# Patient Record
Sex: Male | Born: 1960 | Race: White | Hispanic: No | Marital: Married | Smoking: Never smoker
Health system: Southern US, Community
[De-identification: ages and names within clinical notes are randomized; demographics above are authoritative.]

## PROBLEM LIST (undated history)

## (undated) DIAGNOSIS — E785 Hyperlipidemia, unspecified: Secondary | ICD-10-CM

## (undated) DIAGNOSIS — M545 Low back pain, unspecified: Secondary | ICD-10-CM

## (undated) DIAGNOSIS — Z87442 Personal history of urinary calculi: Secondary | ICD-10-CM

## (undated) DIAGNOSIS — R079 Chest pain, unspecified: Secondary | ICD-10-CM

## (undated) DIAGNOSIS — R12 Heartburn: Secondary | ICD-10-CM

## (undated) DIAGNOSIS — R519 Headache, unspecified: Secondary | ICD-10-CM

## (undated) DIAGNOSIS — R51 Headache: Secondary | ICD-10-CM

## (undated) HISTORY — DX: Headache: R51

## (undated) HISTORY — DX: Low back pain, unspecified: M54.50

## (undated) HISTORY — DX: Personal history of urinary calculi: Z87.442

## (undated) HISTORY — DX: Chest pain, unspecified: R07.9

## (undated) HISTORY — PX: CARDIAC CATHETERIZATION: SHX172

## (undated) HISTORY — PX: HAND SURGERY: SHX662

## (undated) HISTORY — PX: NEPHROLITHOTOMY: SUR881

## (undated) HISTORY — PX: OTHER SURGICAL HISTORY: SHX169

## (undated) HISTORY — DX: Low back pain: M54.5

## (undated) HISTORY — DX: Headache, unspecified: R51.9

## (undated) HISTORY — DX: Hyperlipidemia, unspecified: E78.5

## (undated) HISTORY — DX: Heartburn: R12

---

## 2001-10-17 ENCOUNTER — Emergency Department (HOSPITAL_COMMUNITY): Admission: EM | Admit: 2001-10-17 | Discharge: 2001-10-17 | Payer: Self-pay | Admitting: Emergency Medicine

## 2001-10-17 ENCOUNTER — Encounter: Payer: Self-pay | Admitting: Emergency Medicine

## 2001-10-21 ENCOUNTER — Encounter: Payer: Self-pay | Admitting: Urology

## 2001-10-21 ENCOUNTER — Ambulatory Visit (HOSPITAL_COMMUNITY): Admission: RE | Admit: 2001-10-21 | Discharge: 2001-10-21 | Payer: Self-pay | Admitting: Urology

## 2001-11-09 ENCOUNTER — Ambulatory Visit (HOSPITAL_COMMUNITY): Admission: RE | Admit: 2001-11-09 | Discharge: 2001-11-09 | Payer: Self-pay | Admitting: Urology

## 2001-11-09 ENCOUNTER — Encounter: Payer: Self-pay | Admitting: Urology

## 2001-11-11 ENCOUNTER — Encounter: Payer: Self-pay | Admitting: Urology

## 2001-11-11 ENCOUNTER — Ambulatory Visit (HOSPITAL_COMMUNITY): Admission: RE | Admit: 2001-11-11 | Discharge: 2001-11-11 | Payer: Self-pay | Admitting: Urology

## 2001-12-09 ENCOUNTER — Ambulatory Visit (HOSPITAL_COMMUNITY): Admission: RE | Admit: 2001-12-09 | Discharge: 2001-12-09 | Payer: Self-pay | Admitting: Urology

## 2001-12-09 ENCOUNTER — Encounter: Payer: Self-pay | Admitting: Urology

## 2002-03-03 ENCOUNTER — Ambulatory Visit (HOSPITAL_COMMUNITY): Admission: RE | Admit: 2002-03-03 | Discharge: 2002-03-03 | Payer: Self-pay | Admitting: Urology

## 2002-03-03 ENCOUNTER — Encounter: Payer: Self-pay | Admitting: Urology

## 2002-03-16 ENCOUNTER — Ambulatory Visit (HOSPITAL_COMMUNITY): Admission: RE | Admit: 2002-03-16 | Discharge: 2002-03-16 | Payer: Self-pay | Admitting: Urology

## 2002-03-16 ENCOUNTER — Encounter: Payer: Self-pay | Admitting: Urology

## 2002-03-24 ENCOUNTER — Emergency Department (HOSPITAL_COMMUNITY): Admission: EM | Admit: 2002-03-24 | Discharge: 2002-03-24 | Payer: Self-pay

## 2002-03-27 ENCOUNTER — Inpatient Hospital Stay (HOSPITAL_COMMUNITY): Admission: EM | Admit: 2002-03-27 | Discharge: 2002-03-30 | Payer: Self-pay | Admitting: Emergency Medicine

## 2002-03-27 ENCOUNTER — Encounter: Payer: Self-pay | Admitting: Urology

## 2002-03-29 ENCOUNTER — Encounter: Payer: Self-pay | Admitting: Urology

## 2002-03-30 ENCOUNTER — Encounter: Payer: Self-pay | Admitting: Urology

## 2006-05-23 ENCOUNTER — Emergency Department (HOSPITAL_COMMUNITY): Admission: EM | Admit: 2006-05-23 | Discharge: 2006-05-23 | Payer: Self-pay | Admitting: Emergency Medicine

## 2006-06-03 ENCOUNTER — Encounter: Admission: RE | Admit: 2006-06-03 | Discharge: 2006-06-03 | Payer: Self-pay | Admitting: Cardiology

## 2006-06-04 ENCOUNTER — Encounter: Payer: Self-pay | Admitting: Cardiology

## 2007-06-02 ENCOUNTER — Encounter: Admission: RE | Admit: 2007-06-02 | Discharge: 2007-06-02 | Payer: Self-pay | Admitting: Family Medicine

## 2008-05-31 ENCOUNTER — Emergency Department (HOSPITAL_COMMUNITY): Admission: EM | Admit: 2008-05-31 | Discharge: 2008-06-01 | Payer: Self-pay | Admitting: Emergency Medicine

## 2009-06-20 ENCOUNTER — Ambulatory Visit (HOSPITAL_BASED_OUTPATIENT_CLINIC_OR_DEPARTMENT_OTHER): Admission: RE | Admit: 2009-06-20 | Discharge: 2009-06-20 | Payer: Self-pay | Admitting: Orthopedic Surgery

## 2010-01-15 ENCOUNTER — Emergency Department (HOSPITAL_BASED_OUTPATIENT_CLINIC_OR_DEPARTMENT_OTHER): Admission: EM | Admit: 2010-01-15 | Discharge: 2010-01-15 | Payer: Self-pay | Admitting: Emergency Medicine

## 2010-02-08 ENCOUNTER — Ambulatory Visit: Payer: Self-pay | Admitting: Diagnostic Radiology

## 2010-02-08 ENCOUNTER — Emergency Department (HOSPITAL_BASED_OUTPATIENT_CLINIC_OR_DEPARTMENT_OTHER): Admission: EM | Admit: 2010-02-08 | Discharge: 2010-02-08 | Payer: Self-pay | Admitting: Emergency Medicine

## 2010-06-25 ENCOUNTER — Ambulatory Visit: Payer: Self-pay | Admitting: Cardiology

## 2010-06-26 ENCOUNTER — Ambulatory Visit: Payer: Self-pay | Admitting: Cardiology

## 2010-07-03 ENCOUNTER — Telehealth (INDEPENDENT_AMBULATORY_CARE_PROVIDER_SITE_OTHER): Payer: Self-pay | Admitting: Radiology

## 2010-07-07 ENCOUNTER — Encounter (HOSPITAL_COMMUNITY)
Admission: RE | Admit: 2010-07-07 | Discharge: 2010-07-29 | Payer: Self-pay | Source: Home / Self Care | Attending: Cardiology | Admitting: Cardiology

## 2010-07-07 ENCOUNTER — Ambulatory Visit: Admission: RE | Admit: 2010-07-07 | Discharge: 2010-07-07 | Payer: Self-pay | Source: Home / Self Care

## 2010-07-07 ENCOUNTER — Encounter: Payer: Self-pay | Admitting: Cardiology

## 2010-07-20 ENCOUNTER — Encounter: Payer: Self-pay | Admitting: Cardiology

## 2010-07-31 NOTE — Assessment & Plan Note (Signed)
Summary: Cardiology Nuclear Testing  Nuclear Med Background Indications for Stress Test: Evaluation for Ischemia   History: Myocardial Perfusion Study  History Comments: '07 MPI -nml  Symptoms: Chest Pain    Nuclear Pre-Procedure Cardiac Risk Factors: Family History - CAD, Lipids Caffeine/Decaff Intake: None NPO After: 7:15 AM Lungs: clear IV 0.9% NS with Angio Cath: 20g     IV Site: R Antecubital IV Started by: Irean Hong, RN Chest Size (in) 42     Height (in): 72 Weight (lb): 198 BMI: 26.95  Nuclear Med Study 1 or 2 day study:  1 day     Stress Test Type:  Stress Reading MD:  Olga Millers, MD     Referring MD:  S.Tennant Resting Radionuclide:  Technetium 79m Tetrofosmin     Resting Radionuclide Dose:  11 mCi  Stress Radionuclide:  Technetium 24m Tetrofosmin     Stress Radionuclide Dose:  33 mCi   Stress Protocol Exercise Time (min):  9:00 min     Max HR:  148 bpm     Predicted Max HR:  170 bpm  Max Systolic BP: 202 mm Hg     Percent Max HR:  87.06 %     METS: 10.4 Rate Pressure Product:  16109    Stress Test Technologist:  Milana Na, EMT-P     Nuclear Technologist:  Domenic Polite, CNMT  Rest Procedure  Myocardial perfusion imaging was performed at rest 45 minutes following the intravenous administration of Technetium 53m Tetrofosmin.  Stress Procedure  The patient exercised for 9:00. The patient stopped due to fatigue and denied any chest pain.  There were non specific ST-T wave changes.  Technetium 27m Tetrofosmin was injected at peak exercise and myocardial perfusion imaging was performed after a brief delay.  QPS Raw Data Images:  Acquisition technically good; normal left ventricular size. Stress Images:  There is decreased uptake in the apex. Rest Images:  There is decreased uptake in the apex. Subtraction (SDS):  No evidence of ischemia. Transient Ischemic Dilatation:  1.06  (Normal <1.22)  Lung/Heart Ratio:  .33  (Normal <0.45)  Quantitative  Gated Spect Images QGS EDV:  110 ml QGS ESV:  44 ml QGS EF:  60 % QGS cine images:  Normal wall motion.   Overall Impression  Exercise Capacity: Good exercise capacity. BP Response: Normal blood pressure response. Clinical Symptoms: No chest pain ECG Impression: No significant ST segment change suggestive of ischemia. Overall Impression: Normal stress nuclear study with apical thinning but no ischemia.  Appended Document: Cardiology Nuclear Testing COPY SENT TO DR.TENNANT

## 2010-07-31 NOTE — Progress Notes (Signed)
Summary: nuc pre-procedure  Phone Note Outgoing Call   Call placed by: Domenic Polite, CNMT,  July 03, 2010 1:29 PM Call placed to: Patient Reason for Call: Confirm/change Appt Summary of Call: Left message with information on [employer] Information Sheet (see scanned document for details).       Nuclear Med Background Indications for Stress Test: Evaluation for Ischemia   History: Myocardial Perfusion Study  History Comments: '07 MPI -nml  Symptoms: Chest Pain    Nuclear Pre-Procedure Cardiac Risk Factors: Family History - CAD, Lipids

## 2010-09-12 LAB — CBC
HCT: 38.6 % — ABNORMAL LOW (ref 39.0–52.0)
Hemoglobin: 13.8 g/dL (ref 13.0–17.0)
MCH: 32.6 pg (ref 26.0–34.0)
MCHC: 35.6 g/dL (ref 30.0–36.0)
MCV: 91.7 fL (ref 78.0–100.0)
Platelets: 271 K/uL (ref 150–400)
RBC: 4.21 MIL/uL — ABNORMAL LOW (ref 4.22–5.81)
RDW: 12.3 % (ref 11.5–15.5)
WBC: 9.6 K/uL (ref 4.0–10.5)

## 2010-09-12 LAB — URINALYSIS, ROUTINE W REFLEX MICROSCOPIC
Bilirubin Urine: NEGATIVE
Glucose, UA: NEGATIVE mg/dL
Hgb urine dipstick: NEGATIVE
Ketones, ur: NEGATIVE mg/dL
Nitrite: NEGATIVE
Protein, ur: NEGATIVE mg/dL
Specific Gravity, Urine: 1.016 (ref 1.005–1.030)
Urobilinogen, UA: 0.2 mg/dL (ref 0.0–1.0)
pH: 6.5 (ref 5.0–8.0)

## 2010-09-12 LAB — BASIC METABOLIC PANEL WITH GFR: Creatinine, Ser: 0.9 mg/dL (ref 0.4–1.5)

## 2010-09-12 LAB — BASIC METABOLIC PANEL
BUN: 19 mg/dL (ref 6–23)
CO2: 24 mEq/L (ref 19–32)
Calcium: 9.3 mg/dL (ref 8.4–10.5)
Chloride: 106 mEq/L (ref 96–112)
GFR calc Af Amer: >60 mL/min (ref >60)
GFR calc non Af Amer: >60 mL/min (ref >60)
Glucose, Bld: 151 mg/dL — ABNORMAL HIGH (ref 70–99)
Potassium: 3.8 mEq/L (ref 3.5–5.1)
Sodium: 139 mEq/L (ref 135–145)

## 2010-09-12 LAB — DIFFERENTIAL
Basophils Absolute: 0 K/uL (ref 0.0–0.1)
Basophils Relative: 0 % (ref 0–1)
Eosinophils Absolute: 0.1 10*3/uL (ref 0.0–0.7)
Eosinophils Relative: 1 % (ref 0–5)
Lymphocytes Relative: 16 % (ref 12–46)
Lymphs Abs: 1.5 K/uL (ref 0.7–4.0)
Monocytes Absolute: 0.6 K/uL (ref 0.1–1.0)
Monocytes Relative: 6 % (ref 3–12)
Neutro Abs: 7.4 10*3/uL (ref 1.7–7.7)
Neutrophils Relative %: 77 % (ref 43–77)

## 2010-09-13 LAB — CBC
HCT: 40.9 % (ref 39.0–52.0)
Hemoglobin: 13.9 g/dL (ref 13.0–17.0)
MCH: 31.6 pg (ref 26.0–34.0)
MCHC: 33.9 g/dL (ref 30.0–36.0)
MCV: 93 fL (ref 78.0–100.0)
Platelets: 228 10*3/uL (ref 150–400)
RDW: 12.1 % (ref 11.5–15.5)

## 2010-09-13 LAB — BASIC METABOLIC PANEL
BUN: 19 mg/dL (ref 6–23)
CO2: 27 mEq/L (ref 19–32)
Calcium: 9.4 mg/dL (ref 8.4–10.5)
Chloride: 105 mEq/L (ref 96–112)
GFR calc non Af Amer: >60 mL/min (ref >60)
Glucose, Bld: 105 mg/dL — ABNORMAL HIGH (ref 70–99)
Potassium: 4 mEq/L (ref 3.5–5.1)
Sodium: 143 mEq/L (ref 135–145)

## 2010-09-13 LAB — DIFFERENTIAL
Basophils Absolute: 0.1 10*3/uL (ref 0.0–0.1)
Lymphocytes Relative: 13 % (ref 12–46)
Monocytes Absolute: 1.3 10*3/uL — ABNORMAL HIGH (ref 0.1–1.0)
Monocytes Relative: 9 % (ref 3–12)
Neutrophils Relative %: 77 % (ref 43–77)

## 2010-09-29 LAB — POCT HEMOGLOBIN-HEMACUE: Hemoglobin: 14.1 g/dL (ref 13.0–17.0)

## 2010-11-14 NOTE — H&P (Signed)
NAME:  Anthony Long, Anthony Long                        ACCOUNT NO.:  [id number]   MEDICAL RECORD NO.:  [id number]                   PATIENT TYPE:  INP   LOCATION:  A307                                 FACILITY:  [facility]   PHYSICIAN:  Dennie Maizes, M.D.                DATE OF BIRTH:  Dec 28, 1960   DATE OF ADMISSION:  03/27/2002  DATE OF DISCHARGE:                                HISTORY & PHYSICAL   CHIEF COMPLAINT:  Severe left flank pain radiating to the front, nausea.   HISTORY OF PRESENT ILLNESS:  This 50 year old male has a history of  recurrent urolithiasis.  He had a symptomatic left renal calculus measuring  6 x 5 mm in size.  He has undergone extracorporeal shock-wave lithotripsy of  the left renal calculus on March 14, 2002.  He was seen in the office  for followup.  Followup KUB revealed good fragmentation of the calculus.  The patient has been having severe left flank pain radiating to the front  for the past 3-4 days.  There is associated nausea.  The patient has been to  the emergency room at [facility], as well as [facility].  X-  rays revealed there are still stone fragments in the distal ureter causing  obstruction.  The patient has not passed any stone fragments.  He came to  the emergency room at [facility] today because of severe left flank  pain.  An x-ray of the KUB area was done.  This revealed four stone  fragments in the left distal ureter area each measuring about 2-3 mm in  size.  The patient has been admitted to the hospital for pain control and  further treatment.  He did not have any fever, chills, or gross hematuria.   PAST MEDICAL HISTORY:  History of recurrent urolithiasis, status post ESL of  left ureteral calculus several months ago, status post ESL of left renal  calculus on March 14, 2002.   MEDICATIONS:  Percocet.   ALLERGIES:  None.   PHYSICAL EXAMINATION:  HEENT:  Normal.  NECK:  No masses.  LUNGS:  Clear to  auscultation.  HEART:  Regular rate and rhythm.  No murmurs.  ABDOMEN:  Soft.  No palpable flank mass.  Mild left costovertebral angle  tenderness is noted.  Bladder not palpable.  GENITALIA:  Penis and testes are normal.   IMPRESSION:  Left distal ureteral calculi with obstruction, left renal colic  status post extracorporeal shockwave lithotripsy of left renal calculus.   PLAN:  1. Admit the patient to the hospital.  2. Intravenous fluids,  3. Narcotics.  4. Strain all urine for stones.  5.     We will start the patient on p.o. Levaquin.  6. Discuss with the patient and his family regarding management options.  If     the patient is unable to pass the stone fragments he may need  cystoscopy,     left retrograde pyelogram, and urethroscopy stone extraction.                                               Dennie Maizes, M.D.    SK/MEDQ  D:  03/27/2002  T:  03/28/2002  Job:  [id number]

## 2010-11-14 NOTE — Discharge Summary (Signed)
NAME:  Anthony Long, Anthony Long                        ACCOUNT NO.:  [id number]   MEDICAL RECORD NO.:  [id number]                   PATIENT TYPE:  INP   LOCATION:  A307                                 FACILITY:  [facility]   PHYSICIAN:  Dennie Maizes, M.D.                DATE OF BIRTH:  [birth year]   DATE OF ADMISSION:  03/27/2002  DATE OF DISCHARGE:  03/30/2002                                 DISCHARGE SUMMARY   FINAL DIAGNOSES:  Left distal ureteral calculi with obstruction, left renal  colic, status post ESWL of left renal calculus.   OPERATIVE PROCEDURE:  Cystoscopy, left retrograde pyelogram, left ureteral  stent placement, and left ureteroscopy with stone extraction done on  03/30/02.   DISCHARGE SUMMARY:  This 50 year old male has a history of recurrent  urolithiasis. He has symptomatic left calculus measuring 6 x 5 mm in size.  ESWL of left calculus was done on 03/14/02. The patient was seen in the  office for followup. Followup KUB revealed good fragmentation of the  calculus. The patient was having severe left flank pain radiating to the  front for three to four days. This was associated with nausea. He had been  to the emergency room at [facility] as well as [facility]. X-  rays revealed stone fragments in the distal ureteral causing obstruction.  The patient was unable to pass the stone fragments. He came to the emergency  room at [facility] because of the severe left flank pain. An x-ray  and KUB of the area revealed four stone fragments in the left distal  ureteral, each measuring about 2 to 3 mm in size. The patient was admitted  to the hospital for pain control, IV fluids, and further treatment. There  was no history of fever, chills, or gross hematuria.   PAST MEDICAL HISTORY:  History of recurrent urolithiasis status post ESWL of  left ureteral calculus several months ago status post ESWL of left renal  calculus in 9/03.   MEDICATIONS:  Percocet.   ALLERGIES:  None.   PHYSICAL EXAMINATION:  GENERAL:  The patient was in severe pain.  HEAD, EYES, EARS, NOSE AND THROAT:  Normal.  NECK:  No masses.  LUNGS:  Clear to auscultation.  HEART:  Regular, rate, and rhythm; no murmurs.  ABDOMEN:  Soft; no palpable masses; mild left costovertebral angle  tenderness was noted; bladder not palpable. Penis and testes normal.   HOSPITAL COURSE:  The patient was admitted to the hospital and treated with  IV fluids and parenteral narcotics. He was unable to pass the stone  fragments. After counseling, he was taken to the OR on 03/30/02 and underwent  cystoscopy, left retrograde pyelogram, ureteroscopy and stone extraction,  and left ureteral stent placement. He had good pain relief after this. He  was discharged and sent home on 03/30/02.    DISPOSITION:  The patient was asked to  call me for any fevers, chills, or  gross hematuria. He will be seen in the office in a few days for followup.                                               Dennie Maizes, M.D.    SK/MEDQ  D:  06/21/2002  T:  06/22/2002  Job:  [id number]

## 2010-11-14 NOTE — Op Note (Signed)
NAME:  Anthony Long, Anthony Long                        ACCOUNT NO.:  [id number]   MEDICAL RECORD NO.:  [id number]                   PATIENT TYPE:  INP   LOCATION:  A307                                 FACILITY:  [facility]   PHYSICIAN:  Dennie Maizes, M.D.                DATE OF BIRTH:  01-07-1961   DATE OF PROCEDURE:  DATE OF DISCHARGE:  03/30/2002                                 OPERATIVE REPORT   PREOPERATIVE DIAGNOSES:  1. Left distal ureteral calculi with obstruction.  2. Left renal colic, posterior to the left renal calculus.   POSTOPERATIVE DIAGNOSES:  1. Left distal ureteral calculi with obstruction.  2. Left renal colic, posterior to the left renal calculus.   PROCEDURE:  1. Cystoscopy.  2. Left retrograde pyelogram.  3. Ureteroscopic stone extraction and ureteral stent placement.   SURGEON:  Dennie Maizes, M.D.   ANESTHESIA:  General   COMPLICATIONS:  None.   DRAINS:  A 6 French, 26-cm size left ureteral stent with a string.   INDICATIONS FOR PROCEDURE:  This 50 year old male with symptomatic left  renal calculus was treated with ESL.  The stone was fragmented well and the  stone fragments migrated to the left distal ureter.  He had obstruction and  persistent pain.  He was unable to pass the stone fragments.  He was taken  to the OR today for cystoscopy, left retrograde pyelogram, ureteroscopic  stone extraction and stent placement.   DESCRIPTION OF PROCEDURE:  General anesthesia was induced, and the patient  was placed on the OR table in the dorsal lithotomy position.  The lower  abdomen and genitalia were prepped and draped in a sterile fashion.  Cystoscopy was done with the 25 [place] scope.  The urethral prostate, and  bladder were normal.  A 5 French wedge catheter was then placed in the left  ureteral orifice.  Then 7 cc of Renografin 60 was injected into the  collecting system and a retrograde pyelogram was done.  There were 4 small  filling defects inside the  left distal ureter.  Moderate dilation of the  left upper ureter and renal pelvis were noted.   A 5 French open-ended catheter was then placed into the left ureteral  orifice.  A 0.038 Benson guidewire with a flexible tip was then advanced  into the left renal pelvis.  The open-ended catheter was then removed.  The  left distal ureter was then dilated using an 18 French balloon dilating  catheter.  The balloon dilating catheter was then removed leaving the  guidewire in place.   An 8.5 French rigid ureteroscope was then inserted into the left distal  ureter.  Four stone fragments were noted measuring about 3-4 mm in size.  The 13-mm wire basket was then inserted into the left distal ureter.  All  the stone fragments were retrieved with the stone basket.  Examination of  the ureter was done  up to the pelvic brim and no other abnormality was  noted.  The ureteroscope was removed.  A 6 French 26-cm size ureteral stent  with a string was then placed in the left collecting system over the  guidewire.  The guidewire was then removed.  The patient was transferred to  the PACU in satisfactory condition.                                               Dennie Maizes, M.D.    SK/MEDQ  D:  03/30/2002  T:  03/31/2002  Job:  [id number]

## 2011-01-27 ENCOUNTER — Telehealth: Payer: Self-pay | Admitting: Nurse Practitioner

## 2011-01-27 NOTE — Telephone Encounter (Signed)
[place] from [employer] is faxing authorization to release med rec info today

## 2011-01-29 NOTE — Telephone Encounter (Signed)
Chart placed on [facility] desk today for pickup next Tuesday, request from [employer]

## 2011-04-03 LAB — POCT I-STAT, CHEM 8
BUN: 20 mg/dL (ref 6–23)
HCT: 41 % (ref 39.0–52.0)
Hemoglobin: 13.9 g/dL (ref 13.0–17.0)
Sodium: 140 mEq/L (ref 135–145)
TCO2: 26 mmol/L (ref 0–100)

## 2011-12-09 ENCOUNTER — Encounter: Payer: Self-pay | Admitting: *Deleted

## 2012-01-09 IMAGING — CR DG RIBS W/ CHEST 3+V*L*
3 series · 3 of 3 positions shown · non-contrast
Comparison: Portable chest x-ray of 05/23/2006

CLINICAL DATA: Fell from ladder, posterior rib pain

LEFT RIBS AND CHEST - 3+ VIEW

[w chest pa]
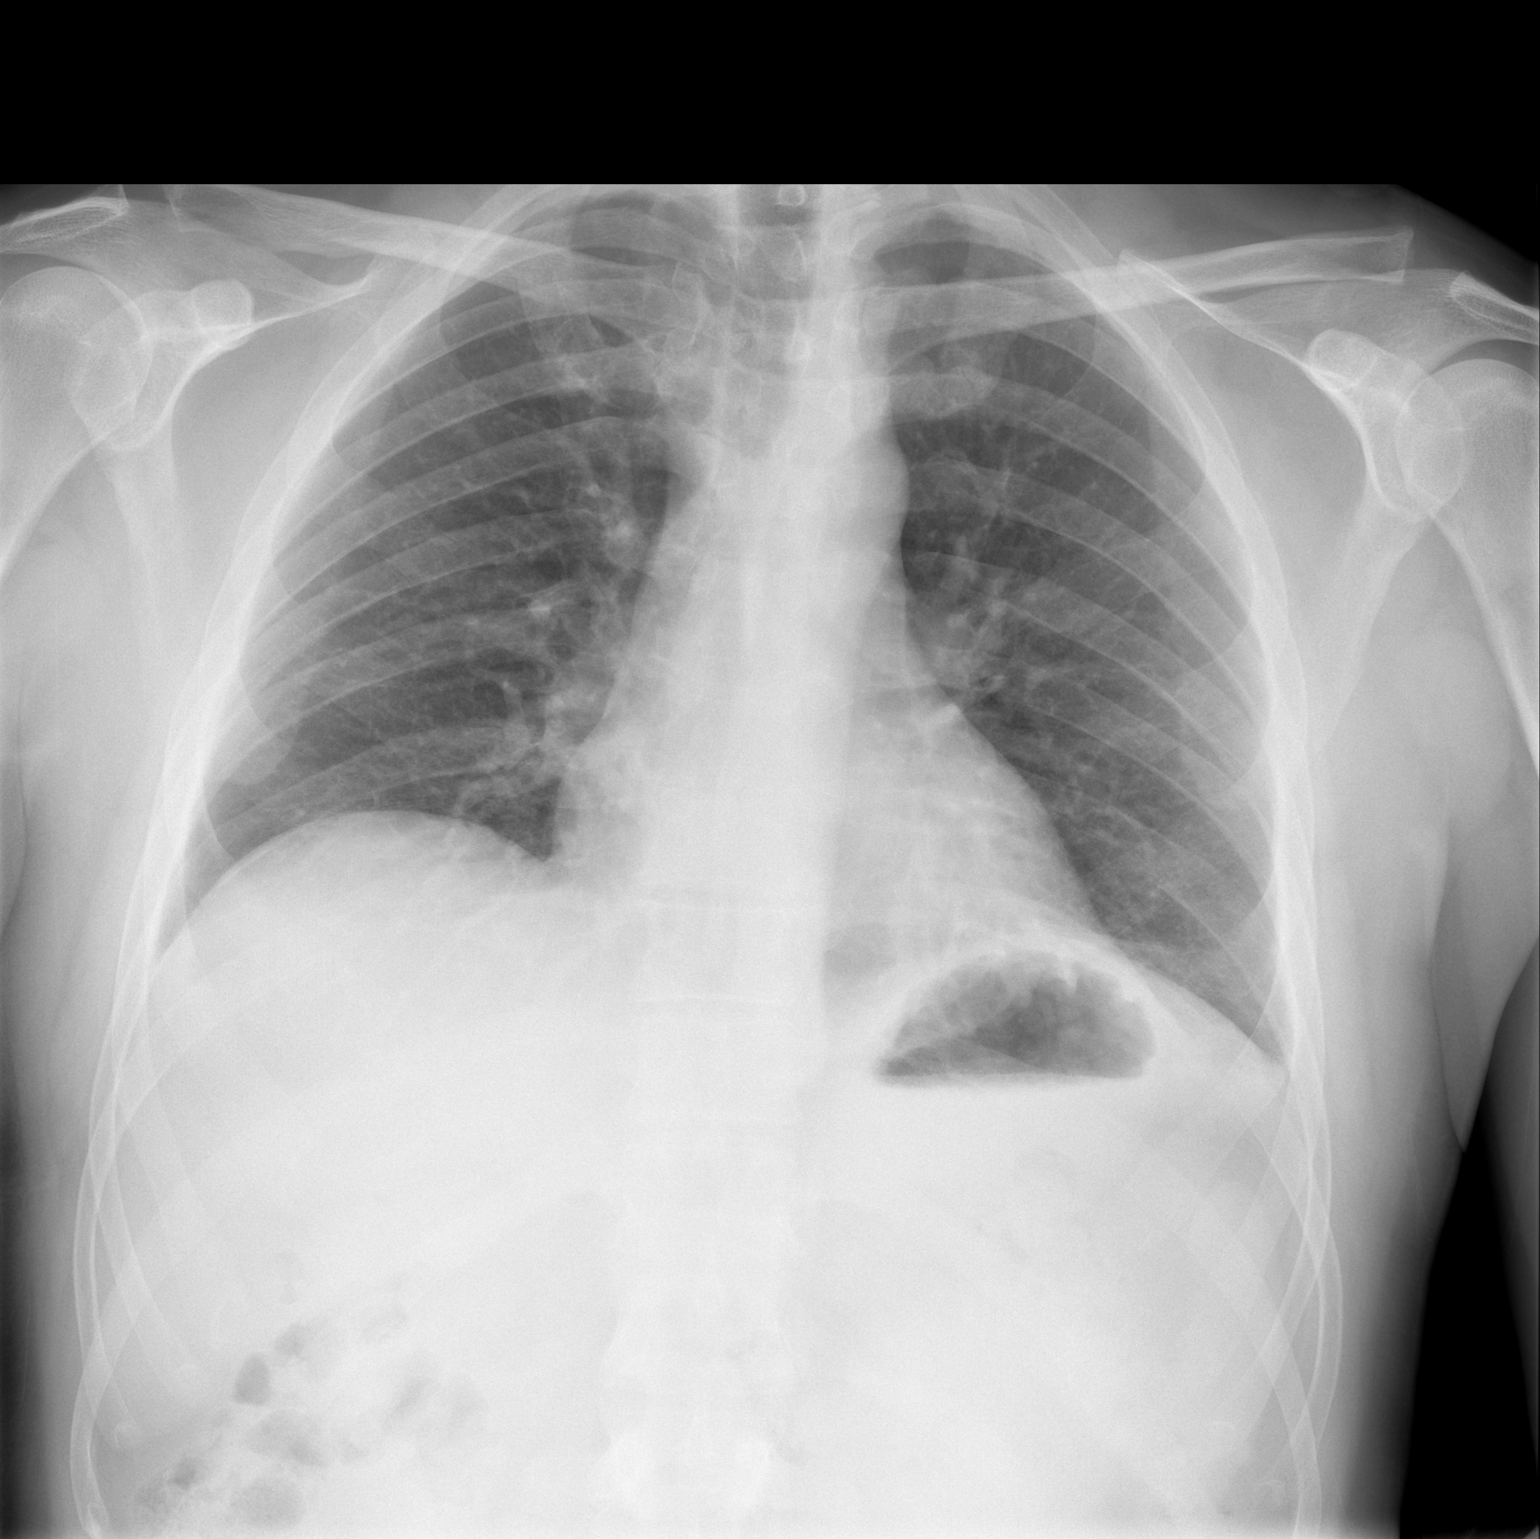

[w ribs ap/pa upper left]
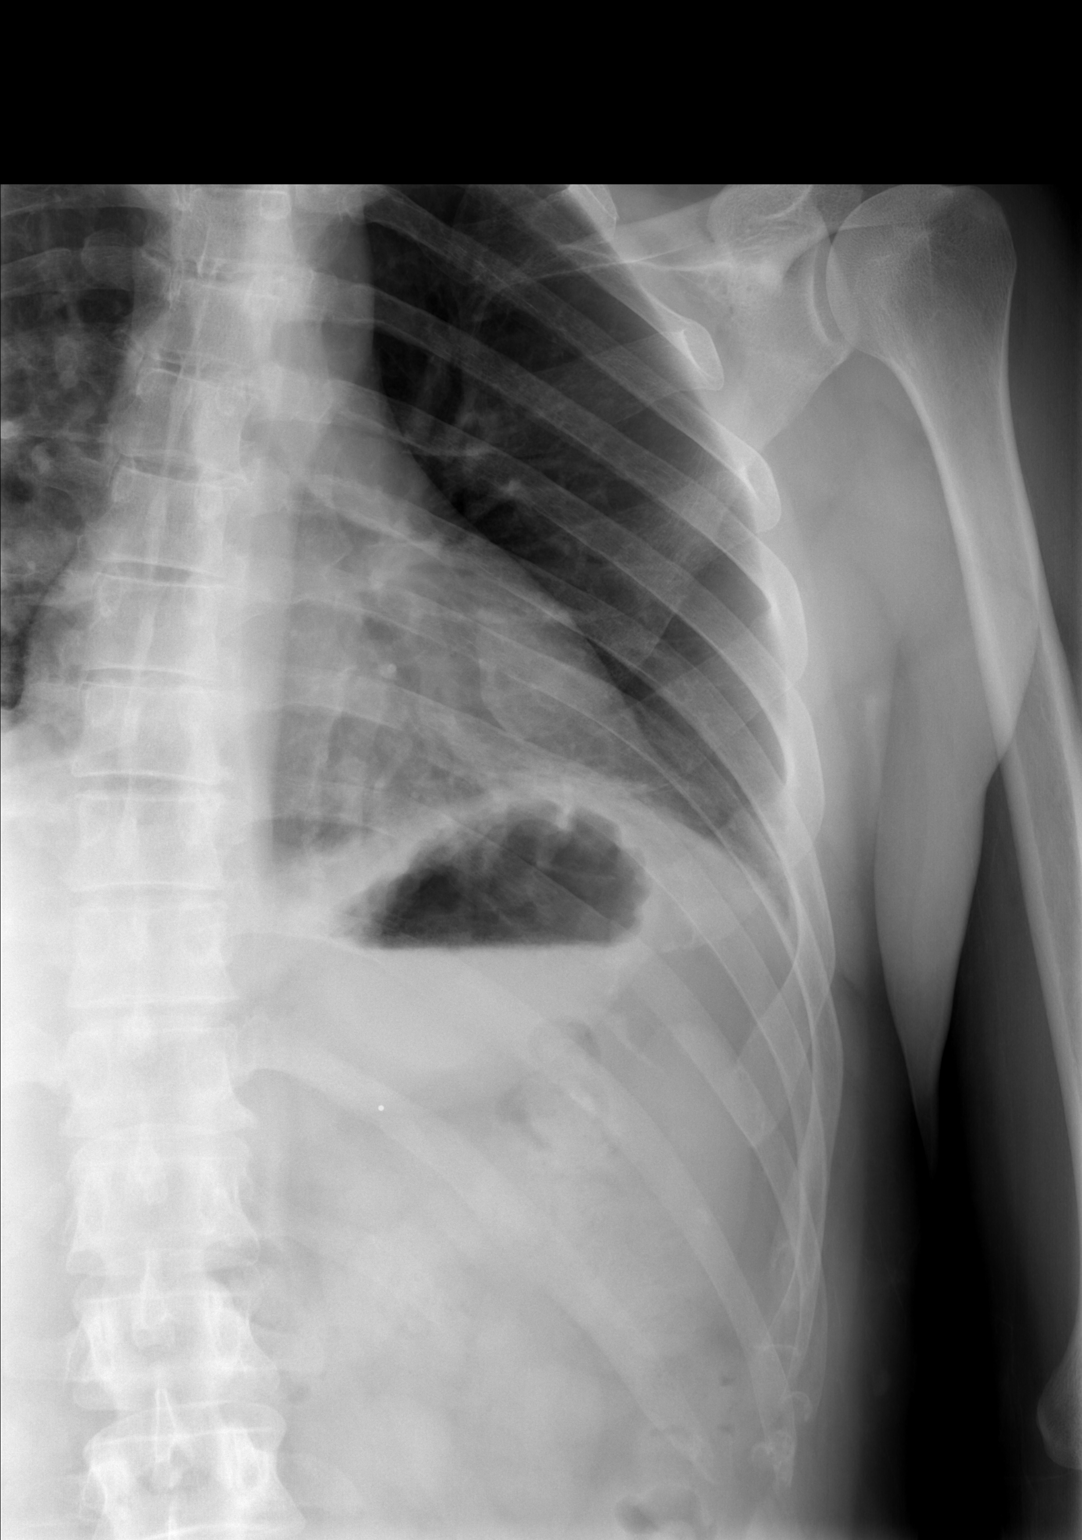

[w ribs oblique left]
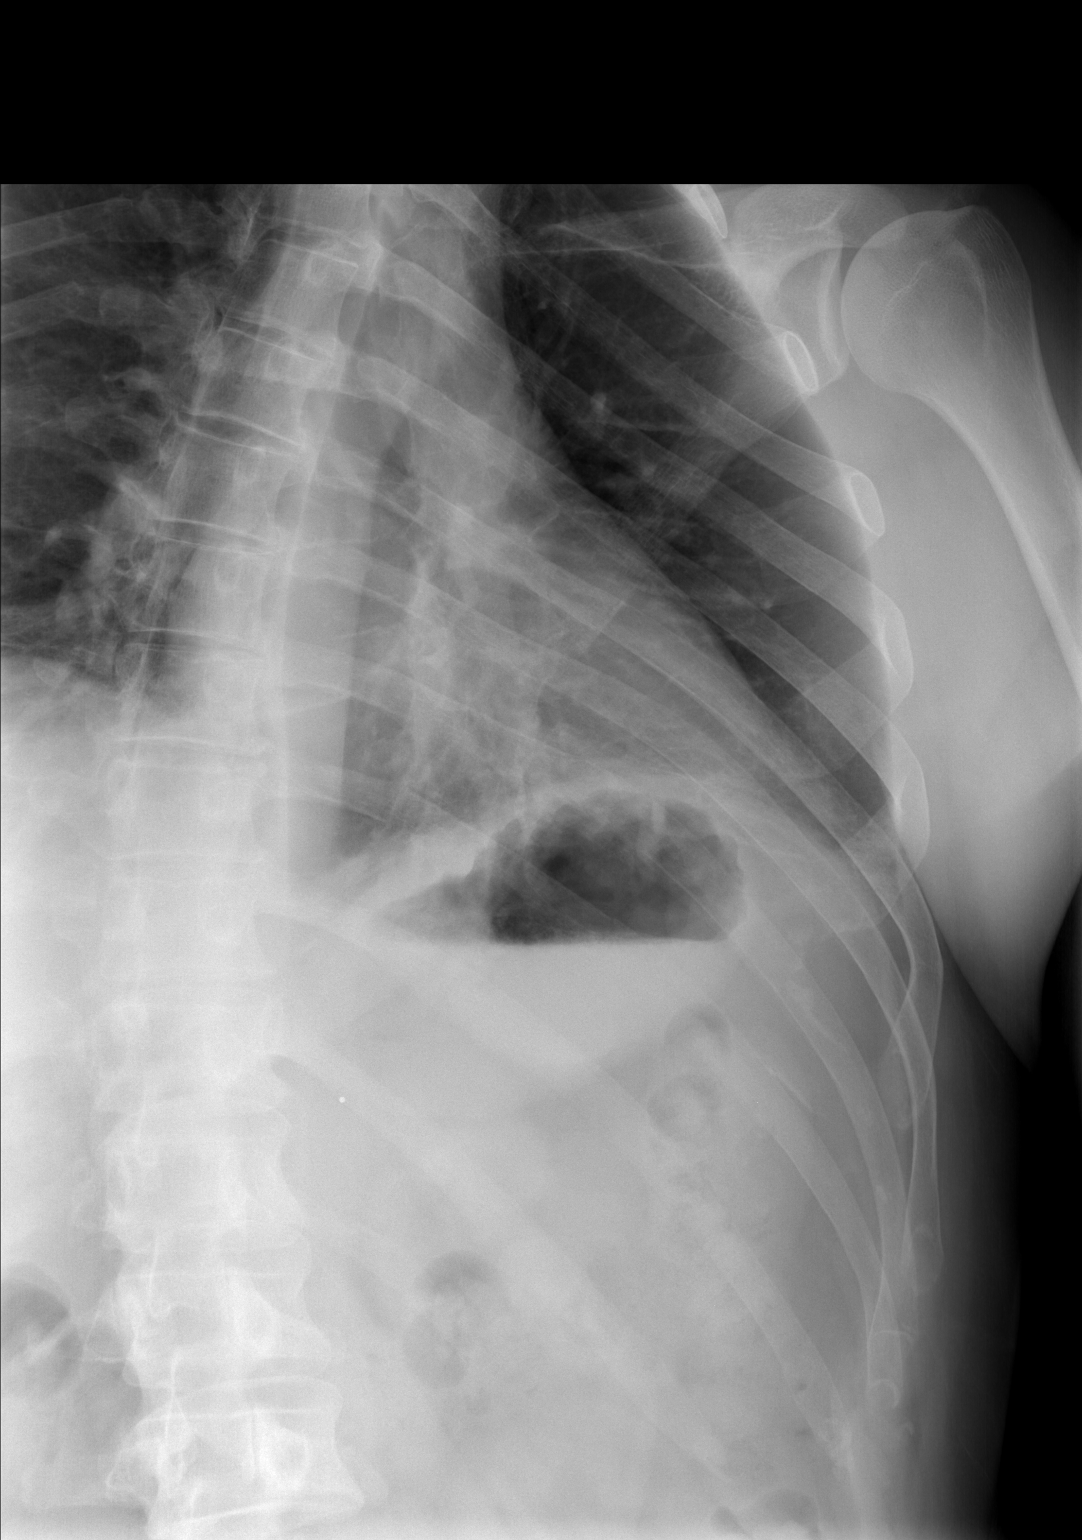

[3 of 3 positions shown; findings below may reference images not displayed]

FINDINGS: The lungs are clear.  Mild left basilar linear
atelectasis is present.  No pneumothorax is seen.  Mediastinal
contours are normal.  The heart is within normal limits in size.
Left rib detail films show no acute left rib fracture.
IMPRESSION: Mild left basilar linear atelectasis.  No acute left rib fracture
is seen.

## 2014-07-12 ENCOUNTER — Encounter (HOSPITAL_BASED_OUTPATIENT_CLINIC_OR_DEPARTMENT_OTHER): Payer: Self-pay | Admitting: *Deleted

## 2014-07-12 ENCOUNTER — Emergency Department (HOSPITAL_BASED_OUTPATIENT_CLINIC_OR_DEPARTMENT_OTHER)
Admission: EM | Admit: 2014-07-12 | Discharge: 2014-07-12 | Disposition: A | Discharge status: Home / Self Care | Payer: BLUE CROSS/BLUE SHIELD | Attending: Emergency Medicine | Admitting: Emergency Medicine

## 2014-07-12 DIAGNOSIS — M542 Cervicalgia: Secondary | ICD-10-CM | POA: Diagnosis present

## 2014-07-12 DIAGNOSIS — Z87442 Personal history of urinary calculi: Secondary | ICD-10-CM | POA: Diagnosis not present

## 2014-07-12 DIAGNOSIS — M5412 Radiculopathy, cervical region: Secondary | ICD-10-CM | POA: Diagnosis not present

## 2014-07-12 DIAGNOSIS — M25511 Pain in right shoulder: Secondary | ICD-10-CM | POA: Diagnosis not present

## 2014-07-12 DIAGNOSIS — Z9889 Other specified postprocedural states: Secondary | ICD-10-CM | POA: Diagnosis not present

## 2014-07-12 DIAGNOSIS — Z79899 Other long term (current) drug therapy: Secondary | ICD-10-CM | POA: Insufficient documentation

## 2014-07-12 DIAGNOSIS — Z8639 Personal history of other endocrine, nutritional and metabolic disease: Secondary | ICD-10-CM | POA: Diagnosis not present

## 2014-07-12 DIAGNOSIS — M549 Dorsalgia, unspecified: Secondary | ICD-10-CM | POA: Diagnosis not present

## 2014-07-12 MED ORDER — HYDROCODONE-ACETAMINOPHEN 5-325 MG PO TABS
2.0000 | ORAL_TABLET | Freq: Once | ORAL | Status: AC
  Administered 2014-07-12: 2 via ORAL
  Filled 2014-07-12: qty 2

## 2014-07-12 MED ORDER — CYCLOBENZAPRINE HCL 10 MG PO TABS
10.0000 mg | ORAL_TABLET | Freq: Once | ORAL | Status: AC
  Administered 2014-07-12: 10 mg via ORAL
  Filled 2014-07-12: qty 1

## 2014-07-12 MED ORDER — ORPHENADRINE CITRATE ER 100 MG PO TB12: 100.0000 mg | ORAL_TABLET | Freq: Two times a day (BID) | ORAL | Status: DC

## 2014-07-12 MED ORDER — PREDNISONE 20 MG PO TABS: ORAL_TABLET | ORAL | Status: DC

## 2014-07-12 MED ORDER — HYDROCODONE-ACETAMINOPHEN 5-325 MG PO TABS: 1.0000 | ORAL_TABLET | ORAL | Status: DC | PRN

## 2014-07-12 MED ORDER — PREDNISONE 50 MG PO TABS
60.0000 mg | ORAL_TABLET | Freq: Once | ORAL | Status: AC
  Administered 2014-07-12: 60 mg via ORAL
  Filled 2014-07-12 (×2): qty 1

## 2014-07-12 NOTE — ED Notes (Signed)
Pt states had a knot in rt back area moving into neck causing pain to left upper arm and elbow w numbness to left lower arm  Denies inj  Onset jan 1

## 2014-07-12 NOTE — Discharge Instructions (Signed)
Cervical Radiculopathy °Cervical radiculopathy happens when a nerve in the neck is pinched or bruised by a slipped (herniated) disk or by arthritic changes in the bones of the cervical spine. This can occur due to an injury or as part of the normal aging process. Pressure on the cervical nerves can cause pain or numbness that runs from your neck all the way down into your arm and fingers. °CAUSES  °There are many possible causes, including: °· Injury. °· Muscle tightness in the neck from overuse. °· Swollen, painful joints (arthritis). °· Breakdown or degeneration in the bones and joints of the spine (spondylosis) due to aging. °· Bone spurs that may develop near the cervical nerves. °SYMPTOMS  °Symptoms include pain, weakness, or numbness in the affected arm and hand. Pain can be severe or irritating. Symptoms may be worse when extending or turning the neck. °DIAGNOSIS  °Your caregiver will ask about your symptoms and do a physical exam. He or she may test your strength and reflexes. X-rays, CT scans, and MRI scans may be needed in cases of injury or if the symptoms do not go away after a period of time. Electromyography (EMG) or nerve conduction testing may be done to study how your nerves and muscles are working. °TREATMENT  °Your caregiver may recommend certain exercises to help relieve your symptoms. Cervical radiculopathy can, and often does, get better with time and treatment. If your problems continue, treatment options may include: °· Wearing a soft collar for short periods of time. °· Physical therapy to strengthen the neck muscles. °· Medicines, such as nonsteroidal anti-inflammatory drugs (NSAIDs), oral corticosteroids, or spinal injections. °· Surgery. Different types of surgery may be done depending on the cause of your problems. °HOME CARE INSTRUCTIONS  °· Put ice on the affected area. °¨ Put ice in a plastic bag. °¨ Place a towel between your skin and the bag. °¨ Leave the ice on for 15-20 minutes,  03-04 times a day or as directed by your caregiver. °· If ice does not help, you can try using heat. Take a warm shower or bath, or use a hot water bottle as directed by your caregiver. °· You may try a gentle neck and shoulder massage. °· Use a flat pillow when you sleep. °· Only take over-the-counter or prescription medicines for pain, discomfort, or fever as directed by your caregiver. °· If physical therapy was prescribed, follow your caregiver's directions. °· If a soft collar was prescribed, use it as directed. °SEEK IMMEDIATE MEDICAL CARE IF:  °· Your pain gets much worse and cannot be controlled with medicines. °· You have weakness or numbness in your hand, arm, face, or leg. °· You have a high fever or a stiff, rigid neck. °· You lose bowel or bladder control (incontinence). °· You have trouble with walking, balance, or speaking. °MAKE SURE YOU:  °· Understand these instructions. °· Will watch your condition. °· Will get help right away if you are not doing well or get worse. °Document Released: 03/10/2001 Document Revised: 09/07/2011 Document Reviewed: 01/27/2011 °ExitCare® Patient Information ©2015 [employer]. This information is not intended to replace advice given to you by your health care provider. Make sure you discuss any questions you have with your health care provider. ° ° ° °Emergency Department Resource Guide °1) Find a Doctor and Pay Out of Pocket °Although you won't have to find out who is covered by your insurance plan, it is a good idea to ask around and get recommendations. You   will then need to call the office and see if the doctor you have chosen will accept you as a new patient and what types of options they offer for patients who are self-pay. Some doctors offer discounts or will set up payment plans for their patients who do not have insurance, but you will need to ask so you aren't surprised when you get to your appointment. ° °2) Contact Your Local Health Department °Not all  health departments have doctors that can see patients for sick visits, but many do, so it is worth a call to see if yours does. If you don't know where your local health department is, you can check in your phone book. The CDC also has a tool to help you locate your state's health department, and many state websites also have listings of all of their local health departments. ° °3) Find a Walk-in Clinic °If your illness is not likely to be very severe or complicated, you may want to try a walk in clinic. These are popping up all over the country in pharmacies, drugstores, and shopping centers. They're usually staffed by nurse practitioners or physician assistants that have been trained to treat common illnesses and complaints. They're usually fairly quick and inexpensive. However, if you have serious medical issues or chronic medical problems, these are probably not your best option. ° °No Primary Care Doctor: °- Call [facility] at  832-8000 - they can help you locate a primary care doctor that  accepts your insurance, provides certain services, etc. °- Physician Referral Service- 1-800-533-3463 ° °Chronic Pain Problems: °Organization         Address  Phone   Notes  ° [facility]  (336) 297-2271 Patients need to be referred by their primary care doctor.  ° °Medication Assistance: °Organization         Address  Phone   Notes  °[place] [facility] [address] °[place], [place] [address] (336) 641-8030 --Must be a resident of [place] °-- Must have NO insurance coverage whatsoever (no Medicaid/ Medicare, etc.) °-- The pt. MUST have a primary care doctor that directs their care regularly and follows them in the community °  °[facility]  (866) 331-1348   °[employer]  (888) 892-1162   ° °Agencies that provide inexpensive medical care: °Organization         Address  Phone   Notes  °[facility]  (336) 832-8035   °[facility]     (336) 832-7272   °[facility] [address] °[place], [place] [address] (336) 832-4777   °[facility] [address], °[place] (336) 271-4999   °[facility]    (336) 373-0678   °[facility]    (336) 272-1050   °[facility] ° [address], [place] Phone:  (336) 832-4444, Fax:  (336) 832-4440 Hours of Operation:  9 am - 6 pm, M-F.  Also accepts Medicaid/Medicare and self-pay.  °[facility] ° [address], [place] Phone: (336) 832-3150, Fax: (336) 832-3151. Hours of Operation:  8:30 am - 5:30 pm, M-F.  Also accepts Medicaid and self-pay.  °[facility] [place] [address], [place] Phone: (336) 878-6027   °[facility] [address], [place], [place] (336)723-1848, Ext. 123 Mondays & Thursdays: 7-9 AM.  First 15 patients are seen on a first come, first   serve basis. °  ° °Medicaid-accepting [place] Providers: ° °Organization         Address  Phone   Notes  °[facility] [address], [place] (336) 641-2100 Also accepts self-pay patients.  °[facility] [address], [place] ° (336) 856-9996   °[facility] [address], [place] (336) 288-8857   °[facility] [address] [place] [address], [place] (336) 299-7000   °Veita Bland [address], [place]  ° (336) 373-1557 Only accepts [place] Access Medicaid patients after they have their name applied to their card.  ° °Self-Pay (no insurance) in [place]: ° °Organization         Address  Phone   Notes  °Sickle Cell Patients, [facility] [address], [place] (336) 832-1970   °[facility] [address], [place] (336) 832-4400   °[facility] ° [address],  (336) 992-4800     °[facility]/Dr. Osei-Bonsu ° [address] [place] [address], [place] or [address], [place] (336) [phone] Phone number for both [place] and [place] locations is the same.  °[facility] [address], [place] (336) 299-0000   °[facility] [address] [place] [address], [place] or [address] (336) 852-7530 °(336) 878-2260   °[facility] [address], [place] (336) 350-1642, phone; (336) 294-5005, fax Sees patients 1st and 3rd Saturday of every month.  Must not qualify for public or private insurance (i.e. Medicaid, Medicare, [place] Health Choice, Veterans' Benefits) • Household income should be no more than 200% of the poverty level •The clinic cannot treat you if you are pregnant or think you are pregnant • Sexually transmitted diseases are not treated at the clinic.  ° ° °Dental Care: °Organization         Address  Phone  Notes  °[facility] [address], [place] (336) 641-6152 Accepts children up to age 21 who are enrolled in Medicaid or [place] Health Choice; pregnant women with a Medicaid card; and children who have applied for Medicaid or [place] Health Choice, but were declined, whose parents can pay a reduced fee at time of service.  °[facility] [place]  [address], [place] (336) 641-7733 Accepts children up to age 21 who are enrolled in Medicaid or [place] Health Choice; pregnant women with a Medicaid card; and children who have applied for Medicaid or [place] Health Choice, but were declined, whose parents can pay a reduced fee at time of service.  °[facility] ° [address],  (336) 641-4533 Patients are seen by appointment only. Walk-ins are not accepted. [facility] will see patients 18 years of age and older. °Monday - Tuesday (8am-5pm) °Most Wednesdays (8:30-5pm) °$30 per visit,  cash only  °[facility] ° [address], [place] (336) 641-4533 Patients are seen by appointment only. Walk-ins are not accepted. [facility] will see patients 18 years of age and older. °One Wednesday Evening (Monthly: Volunteer Based).  $30 per visit, cash only  °[facility]  (919) 537-3737 for adults; Children under age 4, call Graduate Pediatric Dentistry at ([phone]) [phone]. Children aged 4-14, please   call (919) [phone] to request a pediatric application. ° Dental services are provided in all areas of dental care including fillings, crowns and bridges, complete and partial dentures, implants, gum treatment, root canals, and extractions. Preventive care is also provided. Treatment is provided to both adults and children. °Patients are selected via a lottery and there is often a waiting list. °  °[facility] [address], °[place] ° (336) 763-8833 [email or web address] °  °[facility] [address], [place], [place] (336)723-1848, Ext. 123 Second and Fourth Thursday of each month, opens at 6:30 AM; Clinic ends at 9 AM.  Patients are seen on a first-come first-served basis, and a limited number are seen during each clinic.  ° °[facility] ° [address], [place], [place] (336) 723-7904   Eligibility Requirements °You must have lived in [place], [place], or [place] counties for at least the last three months. °  You cannot be eligible for state or federal sponsored healthcare insurance, including Veterans Administration, Medicaid, or Medicare. °  You generally cannot be eligible for healthcare insurance through your employer.  °  How to apply: °Eligibility screenings are held every Tuesday and Wednesday afternoon from 1:00 pm until 4:00 pm. You do not need an appointment for the interview!  °[facility] [address], [place], [place] 336-631-2330   °[facility]   336-342-8273   °[facility]  336-703-3100   °[facility]  336-570-6415   ° °Behavioral Health Resources in the Community: °Intensive Outpatient Programs °Organization         Address  Phone  Notes  °[facility] [address], [place], [place] 336-878-6098   °[facility] [address], [place], [place] 336-832-9800   °[facility] [address], [place], [place] ° 336-882-2125   °[facility] [address],  °[place], [place] 1-800-853-5163 or 336-641-4981   °Substance Abuse Resources °Organization         Address  Phone  Notes  °[facility]  336-882-2125   °[facility]  336-784-9470   °[facility]  336-285-9073   °[facility]  336-845-3988   °Residential & Outpatient Substance Abuse Program  1-800-659-3381   °Psychological Services °Organization         Address  Phone  Notes  °[facility]  336- 832-9600   °[facility]  336- 378-7881   °[facility] [address], [place] 1-800-853-5163 or 336-641-4981   ° °Mobile Crisis Teams °Organization         Address  Phone  Notes  °[facility], Mobile Crisis Care Unit  1-877-626-1772   °Assertive °[facility] ° [address] [place], [place] 336-834-9664   °Sharon DeEsch [address] °[place] 336-554-5454   ° °Self-Help/Support Groups °Organization         Address  Phone             Notes  °[facility] - variety of support groups  336- 373-1402 Call for more information  °Narcotics Anonymous (NA), [facility] [address], °[place]  2 meetings at this location  ° °Residential Treatment Programs °Organization         Address  Phone  Notes  °[facility] [address],    ° [place]  1-866-801-8205   °[facility] °   [address], [place], [place] 704-293-8524    °[facility] [address], [place] 336-845-3988 Admissions: 8am-3pm M-F  °[facility] [address]    °[place], [place] 336-841-1104   °[facility] [address], [place], [place] 336-379-7146   °[facility] [address]  °[place], [place] 336-285-9073   °[facility] Intensive Outpatient [address], [place], [place] 336-852-3033   °[facility] [address]  °[place], [place] 1-877-615-2722 or 336-784-9470   °[facility] [address] [place], [place] 336-227-7417 Accepts Medicaid  °[facility] [address]  °[place] 1-800-659-3381 Substance Abuse/Addiction Treatment  ° °[place] Behavioral Health Resources °Organization         Address  Phone  Notes  °[facility]  (888) 581-9988   °Julie Brannon, PhD [address] [place], [place]   (336) 349-5553 or (336) 951-0000   °[facility]   [address] °[place], [place] (336) 349-4454   °[facility] [address], [place], [place] (336) 342-8316 Insurance/Medicaid/sponsorship through [facility]  °[facility] [address]                                    [place], [place] (336) 342-8316 Therapy/tele-psych/case  °[facility] [address]  ° [place], [place] (336) 349-2233    °Dr. Arfeen  (336) 349-4544   °[facility] [place]  [employer] [place] [facility] 1) [address], [place] °2) [address], [place] °3)  [address], [place] (336) 349-3220 °(336) 342-7768 ° °(336) 342-8140   °[place] [facility] (336) 342-1394 or (336) 342-3537 (After Hours)    ° ° ° °

## 2014-07-12 NOTE — ED Provider Notes (Addendum)
CSN: [id number]     Arrival date & time 07/12/14  2153 History   First MD Initiated Contact with Patient 07/12/14 2301     Chief Complaint  Patient presents with  . Arm Pain  . Neck Pain  . Back Pain     (Consider location/radiation/quality/duration/timing/severity/associated sxs/prior Treatment) HPI The patient started developing pain around the right shoulder and base of his neck 2 weeks ago. He reports that it has now migrated and he's got pain across the back of his left shoulder and radiating into his left arm. The pain is a deep and constant ache. He reports he also feels some numbness in the arm as well. There is no weakness associated. It is exacerbated by turning his head from side to side. He reports he cannot get comfortable in any position. He reports that it is keeping him awake at night. He has no other associated symptoms. He denies any lower extremity symptoms. There is no gait dysfunction, weakness or tingling in the legs. The patient has had no associated chest pain, no associated shortness of breath, no associated nausea. He does have a history of disc problems. Last time he had a problems was about 2 years ago. He reports he has taken ibuprofen and got no relief. Past Medical History  Diagnosis Date  . Chest pain, unspecified   . Generalized headaches   . Low back pain   . Heartburn   . Hyperlipidemia   . History of kidney stones    Past Surgical History  Procedure Laterality Date  . Vasectomy    . Nephrolithotomy    . Hand surgery      plate and repair of FX  . Cardiac catheterization     Family History  Problem Relation Age of Onset  . Stroke Father   . Lymphoma Father   . Heart disease Neg Hx    History  Substance Use Topics  . Smoking status: Never Smoker   . Smokeless tobacco: Not on file  . Alcohol Use: Yes     Comment: 3 beers a week    Review of Systems Cardio vascular: No chest pain no shortness of breath, no lightheadedness GI: No  associated abdominal pain nausea or vomiting.   Allergies  Codeine  Home Medications   Prior to Admission medications   Medication Sig Start Date End Date Taking? Authorizing Provider  Sertraline HCl (ZOLOFT PO) Take by mouth.   Yes Historical Provider, MD  HYDROcodone-acetaminophen (NORCO/VICODIN) 5-325 MG per tablet Take 1-2 tablets by mouth every 4 (four) hours as needed for moderate pain or severe pain. 07/12/14   Arby BarretteMarcy Makynlee Kressin, MD  orphenadrine (NORFLEX) 100 MG tablet Take 1 tablet (100 mg total) by mouth 2 (two) times daily. 07/12/14   Arby BarretteMarcy Rosario Duey, MD  predniSONE (DELTASONE) 20 MG tablet 3 tabs po daily x 3 days, then 2 tabs x 3 days, then 1.5 tabs x 3 days, then 1 tab x 3 days, then 0.5 tabs x 3 days 07/12/14   Arby BarretteMarcy Cyriah Childrey, MD   BP 151/96 mmHg  Pulse 90  Temp(Src) 98.1 F (36.7 C) (Oral)  Resp 18  SpO2 100% Physical Exam  Constitutional: He is oriented to person, place, and time. He appears well-developed and well-nourished.  HENT:  Head: Normocephalic and atraumatic.  Eyes: EOM are normal. Pupils are equal, round, and reactive to light.  Neck: Neck supple.  Cardiovascular: Normal rate, regular rhythm, normal heart sounds and intact distal pulses.   Pulmonary/Chest: Effort normal  and breath sounds normal.  Abdominal: Soft. Bowel sounds are normal. He exhibits no distension. There is no tenderness.  Musculoskeletal: Normal range of motion. He exhibits no edema.  Neurological: He is alert and oriented to person, place, and time. He has normal strength. He exhibits normal muscle tone. Coordination normal. GCS eye subscore is 4. GCS verbal subscore is 5. GCS motor subscore is 6.  The patient has intact 5 out of 5 strength testing in the left upper extremity. Sensation is intact. Radial pulses are 2+ and symmetric.  Skin: Skin is warm, dry and intact.  Psychiatric: He has a normal mood and affect.    ED Course  Procedures (including critical care time) Labs Review Labs  Reviewed - No data to display  Imaging Review No results found.   EKG Interpretation None      MDM   Final diagnoses:  Cervical radiculopathy   Patient presents with symptoms suggestive of radiculopathy. At this point time there is no arm dysfunction or weakness. The patient is counseled on following up with his family physician for further diagnostic evaluation and potentially orthopedic referral as well. He is counseled on the necessity to return if there is development of any weakness, motor dysfunction or worsening symptoms.   Arby Barrette, MD 07/12/14 4098  Arby Barrette, MD 07/12/14 [phone]

## 2014-07-12 NOTE — ED Notes (Signed)
Pain in his neck, upper back and sharp pain with numbness in his left arm. No injury. Denies chest pain. Pain has been on going for 2 weeks.

## 2014-07-12 NOTE — ED Notes (Signed)
Pt requesting pain meds,, rn talked to md  Was told could ibu,  rn went to tell pt that he could have ibu,  Pt stated that he had taken 1000 mg at 2000 without relief  Stated he would try a ice pack which was given ton  him

## 2015-01-28 ENCOUNTER — Other Ambulatory Visit (HOSPITAL_COMMUNITY): Payer: Self-pay | Admitting: Physician Assistant

## 2015-01-28 DIAGNOSIS — R0609 Other forms of dyspnea: Principal | ICD-10-CM

## 2015-01-30 ENCOUNTER — Telehealth (HOSPITAL_COMMUNITY): Payer: Self-pay

## 2015-01-30 NOTE — Telephone Encounter (Signed)
Encounter complete. 

## 2015-02-01 ENCOUNTER — Ambulatory Visit (HOSPITAL_COMMUNITY)
Admission: RE | Admit: 2015-02-01 | Discharge: 2015-02-01 | Disposition: A | Discharge status: Home / Self Care | Payer: BLUE CROSS/BLUE SHIELD | Source: Ambulatory Visit | Attending: Cardiovascular Disease | Admitting: Cardiovascular Disease

## 2015-02-01 DIAGNOSIS — R9439 Abnormal result of other cardiovascular function study: Secondary | ICD-10-CM | POA: Diagnosis not present

## 2015-02-01 DIAGNOSIS — R0609 Other forms of dyspnea: Secondary | ICD-10-CM | POA: Diagnosis not present

## 2015-02-01 LAB — MYOCARDIAL PERFUSION IMAGING
CHL CUP MPHR: 166 {beats}/min
CHL CUP NUCLEAR SDS: 2
CHL CUP NUCLEAR SRS: 2
CHL CUP NUCLEAR SSS: 4
CSEPED: 10 min
CSEPEDS: 12 s
CSEPEW: 12 METS
CSEPHR: 97 %
LV dias vol: 106 mL
LV sys vol: 40 mL
NUC STRESS TID: 0.87
Peak HR: 162 {beats}/min
RPE: 14
Rest HR: 59 {beats}/min

## 2015-02-01 MED ORDER — TECHNETIUM TC 99M SESTAMIBI GENERIC - CARDIOLITE: 30.3000 | Freq: Once | INTRAVENOUS | Status: AC | PRN

## 2015-02-01 MED ORDER — TECHNETIUM TC 99M SESTAMIBI GENERIC - CARDIOLITE: 10.9000 | Freq: Once | INTRAVENOUS | Status: AC | PRN

## 2017-01-01 IMAGING — NM NM MISC PROCEDURE
6 series · 36 of 36 positions shown · non-contrast
Comparison: none

[Series 1: wbr_r-proj_st wbr rest · 6.40mm/px · 6 of 64 frames shown]
[frame 6/64]
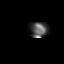
[frame 16/64]
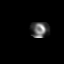
[frame 27/64]
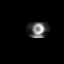
[frame 38/64]
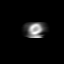
[frame 48/64]
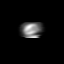
[frame 59/64]
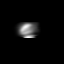

[Series 1: wbr rest · 6.40mm/px · 6 of 64 frames shown]
[frame 6/64]
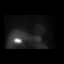
[frame 16/64]
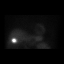
[frame 27/64]
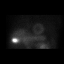
[frame 38/64]
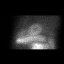
[frame 48/64]
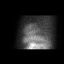
[frame 59/64]
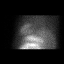

[Series 2: wbr stress-gsp · 6.40mm/px · 6 of 512 frames shown]
[frame 43/512]
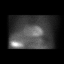
[frame 128/512]
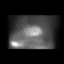
[frame 214/512]
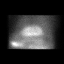
[frame 299/512]
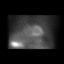
[frame 384/512]
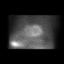
[frame 470/512]
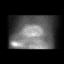

[Series 2: wbr_s-proj_st wbr stress-gsp · 6.40mm/px · 6 of 512 frames shown]
[frame 43/512]
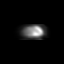
[frame 128/512]
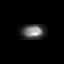
[frame 214/512]
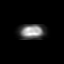
[frame 299/512]
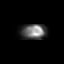
[frame 384/512]
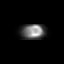
[frame 470/512]
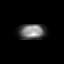

[Series 3: wbr stress-sum-em · 6.40mm/px · 6 of 64 frames shown]
[frame 6/64]
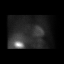
[frame 16/64]
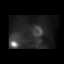
[frame 27/64]
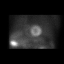
[frame 38/64]
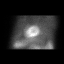
[frame 48/64]
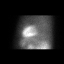
[frame 59/64]
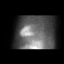

[Series 3: wbr_s-proj_st wbr stress-sum-em · 6.40mm/px · 6 of 64 frames shown]
[frame 6/64]
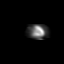
[frame 16/64]
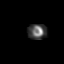
[frame 27/64]
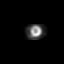
[frame 38/64]
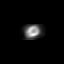
[frame 48/64]
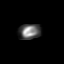
[frame 59/64]
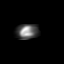

[36 of 36 positions shown; findings below may reference images not displayed]

Canned report from images found in remote index.

Refer to host system for actual result text.

## 2018-01-21 DIAGNOSIS — Z0000 Encounter for general adult medical examination without abnormal findings: Secondary | ICD-10-CM | POA: Diagnosis not present

## 2018-01-21 DIAGNOSIS — E782 Mixed hyperlipidemia: Secondary | ICD-10-CM | POA: Diagnosis not present

## 2018-04-11 DIAGNOSIS — Z23 Encounter for immunization: Secondary | ICD-10-CM | POA: Diagnosis not present

## 2019-01-23 DIAGNOSIS — E782 Mixed hyperlipidemia: Secondary | ICD-10-CM | POA: Diagnosis not present

## 2019-01-23 DIAGNOSIS — Z0000 Encounter for general adult medical examination without abnormal findings: Secondary | ICD-10-CM | POA: Diagnosis not present

## 2019-03-21 DIAGNOSIS — Z1159 Encounter for screening for other viral diseases: Secondary | ICD-10-CM | POA: Diagnosis not present

## 2019-03-24 DIAGNOSIS — K635 Polyp of colon: Secondary | ICD-10-CM | POA: Diagnosis not present

## 2019-03-24 DIAGNOSIS — K5730 Diverticulosis of large intestine without perforation or abscess without bleeding: Secondary | ICD-10-CM | POA: Diagnosis not present

## 2019-03-24 DIAGNOSIS — D125 Benign neoplasm of sigmoid colon: Secondary | ICD-10-CM | POA: Diagnosis not present

## 2019-03-24 DIAGNOSIS — K640 First degree hemorrhoids: Secondary | ICD-10-CM | POA: Diagnosis not present

## 2019-03-24 DIAGNOSIS — Z86010 Personal history of colonic polyps: Secondary | ICD-10-CM | POA: Diagnosis not present

## 2019-04-13 DIAGNOSIS — Z23 Encounter for immunization: Secondary | ICD-10-CM | POA: Diagnosis not present

## 2019-12-27 DIAGNOSIS — Z125 Encounter for screening for malignant neoplasm of prostate: Secondary | ICD-10-CM | POA: Diagnosis not present

## 2019-12-27 DIAGNOSIS — R238 Other skin changes: Secondary | ICD-10-CM | POA: Diagnosis not present

## 2019-12-27 DIAGNOSIS — E782 Mixed hyperlipidemia: Secondary | ICD-10-CM | POA: Diagnosis not present

## 2019-12-27 DIAGNOSIS — Z0000 Encounter for general adult medical examination without abnormal findings: Secondary | ICD-10-CM | POA: Diagnosis not present

## 2019-12-27 DIAGNOSIS — R7303 Prediabetes: Secondary | ICD-10-CM | POA: Diagnosis not present

## 2022-01-26 ENCOUNTER — Ambulatory Visit
Admission: EM | Admit: 2022-01-26 | Discharge: 2022-01-26 | Disposition: A | Discharge status: Home / Self Care | Payer: 59 | Attending: Urgent Care | Admitting: Urgent Care

## 2022-01-26 ENCOUNTER — Ambulatory Visit (INDEPENDENT_AMBULATORY_CARE_PROVIDER_SITE_OTHER): Payer: 59

## 2022-01-26 ENCOUNTER — Encounter: Payer: Self-pay | Admitting: Urgent Care

## 2022-01-26 DIAGNOSIS — M5432 Sciatica, left side: Secondary | ICD-10-CM | POA: Diagnosis not present

## 2022-01-26 DIAGNOSIS — R200 Anesthesia of skin: Secondary | ICD-10-CM

## 2022-01-26 DIAGNOSIS — S80212A Abrasion, left knee, initial encounter: Secondary | ICD-10-CM | POA: Diagnosis not present

## 2022-01-26 DIAGNOSIS — M79671 Pain in right foot: Secondary | ICD-10-CM

## 2022-01-26 DIAGNOSIS — S9031XA Contusion of right foot, initial encounter: Secondary | ICD-10-CM

## 2022-01-26 DIAGNOSIS — R202 Paresthesia of skin: Secondary | ICD-10-CM

## 2022-01-26 MED ORDER — TIZANIDINE HCL 4 MG PO TABS: 4.0000 mg | ORAL_TABLET | Freq: Every day | ORAL | 0 refills | Status: AC

## 2022-01-26 MED ORDER — PREDNISONE 20 MG PO TABS: ORAL_TABLET | ORAL | 0 refills | Status: AC

## 2022-01-26 MED ORDER — BACITRACIN ZINC 500 UNIT/GM EX OINT: 1.0000 | TOPICAL_OINTMENT | Freq: Two times a day (BID) | CUTANEOUS | 0 refills | Status: AC

## 2022-01-26 NOTE — ED Provider Notes (Signed)
[facility]   MRN: [id number] DOB: 1961-03-20  Subjective:   Anthony Long is a 61 y.o. male presenting for suffering a right foot injury today while playing kickball.  Patient states that he collided against another player and made impact with his foot causing it to twist as well.  He also scraped his left knee from the collision.  Tdap is up-to-date per patient.  He is also had about 1 month history of left leg numbness and tingling. No fall, trauma, numbness or tingling, saddle paresthesia, changes to bowel or urinary habits, radicular symptoms.  He does have a history of neck issues and has had some procedures done to help him with that.  He does have a back specialist.  No head injury, loss consciousness, confusion, headache, vision changes.   No current facility-administered medications for this encounter. No current outpatient medications on file.   Allergies  Allergen Reactions   Codeine     REACTION: Reaction not known    Past Medical History:  Diagnosis Date   Chest pain, unspecified    Generalized headaches    Heartburn    History of kidney stones    Hyperlipidemia    Low back pain      Past Surgical History:  Procedure Laterality Date   CARDIAC CATHETERIZATION     HAND SURGERY     plate and repair of FX   NEPHROLITHOTOMY     vasectomy      Family History  Problem Relation Age of Onset   Stroke Father    Lymphoma Father    Heart disease Neg Hx     Social History   Tobacco Use   Smoking status: Never  Substance Use Topics   Alcohol use: Yes    Comment: occasional   Drug use: No    ROS   Objective:   Vitals: BP (!) 153/80   Pulse 87   Temp 99.2 F (37.3 C)   Resp 20   SpO2 97%   Physical Exam Constitutional:      General: He is not in acute distress.    Appearance: Normal appearance. He is well-developed and normal weight. He is not ill-appearing, toxic-appearing or diaphoretic.  HENT:     Head:  Normocephalic and atraumatic.     Right Ear: External ear normal.     Left Ear: External ear normal.     Nose: Nose normal.     Mouth/Throat:     Pharynx: Oropharynx is clear.  Eyes:     General: No scleral icterus.       Right eye: No discharge.        Left eye: No discharge.     Extraocular Movements: Extraocular movements intact.  Cardiovascular:     Rate and Rhythm: Normal rate.  Pulmonary:     Effort: Pulmonary effort is normal.  Musculoskeletal:     Cervical back: Normal range of motion.     Lumbar back: No swelling, edema, deformity, signs of trauma, lacerations, spasms, tenderness or bony tenderness. Normal range of motion. Positive left straight leg raise test. Negative right straight leg raise test. No scoliosis.     Left knee: No swelling, deformity, effusion, erythema, ecchymosis, lacerations, bony tenderness or crepitus. Normal range of motion. Tenderness (over abrasion only) present. No medial joint line, lateral joint line or patellar tendon tenderness. Normal alignment and normal patellar mobility.     Right foot: Decreased range of motion. Normal capillary refill. Tenderness and  bony tenderness (Over area outlined) present. No swelling, deformity, laceration or crepitus.       Legs:       Feet:  Skin:    General: Skin is warm and dry.  Neurological:     Mental Status: He is alert and oriented to person, place, and time.  Psychiatric:        Mood and Affect: Mood normal.        Behavior: Behavior normal.        Thought Content: Thought content normal.        Judgment: Judgment normal.    DG Foot Complete Right  Result Date: 01/26/2022 CLINICAL DATA:  Right foot pain, injury.  Skip EXAM: RIGHT FOOT COMPLETE - 3+ VIEW COMPARISON:  None Available. FINDINGS: There is no evidence of fracture or dislocation. Plantar and posterior calcaneal spurs are present. There are mild degenerative changes of the first metatarsophalangeal joint. Soft tissues are unremarkable.  IMPRESSION: 1. No acute bony abnormality. Electronically Signed   By: Darliss Cheney M.D.   On: 01/26/2022 19:15    Bacitracin was applied to the wound of the left knee, covered with nonadherent and secured with Coban.  Assessment and Plan :   PDMP not reviewed this encounter.  1. Right foot pain   2. Left sided sciatica   3. Numbness and tingling of left leg   4. Abrasion of left knee, initial encounter   5. Contusion of right foot, initial encounter    Patient was provided with a postop shoe for his severe right foot pain.  Recommended an oral prednisone course in the context of his sciatica, severe right foot pain.  Discussed wound care for the left knee abrasion.  Recommended follow-up with his spine specialist as soon as possible as patient likely will need an MRI. Counseled patient on potential for adverse effects with medications prescribed/recommended today, ER and return-to-clinic precautions discussed, patient verbalized understanding.    Wallis Bamberg, [place] 01/27/22 [phone]

## 2022-01-26 NOTE — Discharge Instructions (Addendum)
For your sciatica, low back pain use prednisone as a steroid course. This can also help you with your foot pain. Make sure you follow up with your spine specialist as I believe you would benefit from getting an MRI of your low back given your symptoms. Call your back specialist as soon as possible.   Change your dressing 2-3 times daily. Every time you change your dressing, clean the wound gently with warm water and Dial antibacterial soap. Pat the wound dry, let it breathe for roughly an hour before covering it back up. When you reapply a dressing, use non-stick/non-adherent gauze. Apply Bacitracin ointment to the wound. After 4-7 days, the wound should scab over nicely and then you don't have to continue doing dressings.

## 2022-01-26 NOTE — ED Triage Notes (Signed)
Pt here with falls in the past month, but fell today playing kickball and ran into someone hurting the right foot.
